# Patient Record
Sex: Female | Born: 1975
Health system: Southern US, Community
[De-identification: ages and names within clinical notes are randomized; demographics above are authoritative.]

## PROBLEM LIST (undated history)

## (undated) DIAGNOSIS — J45909 Unspecified asthma, uncomplicated: Secondary | ICD-10-CM

## (undated) HISTORY — DX: Unspecified asthma, uncomplicated: J45.909

---

## 2017-08-27 DIAGNOSIS — J45909 Unspecified asthma, uncomplicated: Secondary | ICD-10-CM | POA: Diagnosis not present

## 2017-08-27 DIAGNOSIS — Z13220 Encounter for screening for lipoid disorders: Secondary | ICD-10-CM | POA: Diagnosis not present

## 2017-08-27 DIAGNOSIS — Z681 Body mass index (BMI) 19 or less, adult: Secondary | ICD-10-CM | POA: Diagnosis not present

## 2017-08-27 DIAGNOSIS — Z131 Encounter for screening for diabetes mellitus: Secondary | ICD-10-CM | POA: Diagnosis not present

## 2017-11-13 ENCOUNTER — Telehealth: Payer: Self-pay | Admitting: Family Medicine

## 2017-11-13 MED ORDER — BENZONATATE 100 MG PO CAPS: 100.0000 mg | ORAL_CAPSULE | Freq: Three times a day (TID) | ORAL | 0 refills | Status: DC | PRN

## 2017-11-13 NOTE — Telephone Encounter (Signed)
Patient calling complaining of significant cough and unable to work.  Requesting Tessalon Perles be sent to [facility] on [address] in [place].

## 2018-03-05 ENCOUNTER — Encounter: Payer: Self-pay | Admitting: Family Medicine

## 2018-03-11 DIAGNOSIS — Z6828 Body mass index (BMI) 28.0-28.9, adult: Secondary | ICD-10-CM | POA: Diagnosis not present

## 2018-03-11 DIAGNOSIS — Z1331 Encounter for screening for depression: Secondary | ICD-10-CM | POA: Diagnosis not present

## 2018-03-11 DIAGNOSIS — L03032 Cellulitis of left toe: Secondary | ICD-10-CM | POA: Diagnosis not present

## 2018-03-23 DIAGNOSIS — Z6829 Body mass index (BMI) 29.0-29.9, adult: Secondary | ICD-10-CM | POA: Diagnosis not present

## 2018-03-23 DIAGNOSIS — J029 Acute pharyngitis, unspecified: Secondary | ICD-10-CM | POA: Diagnosis not present

## 2018-04-06 DIAGNOSIS — Z0000 Encounter for general adult medical examination without abnormal findings: Secondary | ICD-10-CM | POA: Diagnosis not present

## 2018-04-06 DIAGNOSIS — Z6828 Body mass index (BMI) 28.0-28.9, adult: Secondary | ICD-10-CM | POA: Diagnosis not present

## 2018-07-07 DIAGNOSIS — Z23 Encounter for immunization: Secondary | ICD-10-CM | POA: Diagnosis not present

## 2018-08-12 DIAGNOSIS — H524 Presbyopia: Secondary | ICD-10-CM | POA: Diagnosis not present

## 2018-08-12 DIAGNOSIS — H52222 Regular astigmatism, left eye: Secondary | ICD-10-CM | POA: Diagnosis not present

## 2018-08-12 DIAGNOSIS — H5213 Myopia, bilateral: Secondary | ICD-10-CM | POA: Diagnosis not present

## 2019-03-02 DIAGNOSIS — J45909 Unspecified asthma, uncomplicated: Secondary | ICD-10-CM | POA: Diagnosis not present

## 2019-03-09 DIAGNOSIS — Z6828 Body mass index (BMI) 28.0-28.9, adult: Secondary | ICD-10-CM | POA: Diagnosis not present

## 2019-03-09 DIAGNOSIS — Z01419 Encounter for gynecological examination (general) (routine) without abnormal findings: Secondary | ICD-10-CM | POA: Diagnosis not present

## 2019-03-09 DIAGNOSIS — Z1151 Encounter for screening for human papillomavirus (HPV): Secondary | ICD-10-CM | POA: Diagnosis not present

## 2019-04-21 DIAGNOSIS — Z13220 Encounter for screening for lipoid disorders: Secondary | ICD-10-CM | POA: Diagnosis not present

## 2019-04-21 DIAGNOSIS — Z0000 Encounter for general adult medical examination without abnormal findings: Secondary | ICD-10-CM | POA: Diagnosis not present

## 2019-04-21 DIAGNOSIS — Z131 Encounter for screening for diabetes mellitus: Secondary | ICD-10-CM | POA: Diagnosis not present

## 2020-01-03 ENCOUNTER — Telehealth: Payer: Self-pay

## 2020-01-03 NOTE — Telephone Encounter (Signed)
The patient has a new patient appointment with Dr. Tobie Poet on Wednesday, March 31 at 9:00 AM. I called Tamala to reschedule the appointment to a later date due to Dr. Tobie Poet being out of the office.

## 2020-02-01 NOTE — Progress Notes (Signed)
Subjective:  Patient ID: Haley Terry, female    DOB: [birth year]  Age: 44 y.o. MRN: [id number]  Chief Complaint  Patient presents with  . Establish Care    Patient recently has Lipids, CMP and Nicotine panels drawns  . Asthma    HPI Dr. Warmoth is a 44 year old Hispanic female who presents to establish care.  Her only significant medical issue is asthma which she says she has had her whole life.  She is currently well controlled on Qvar 40 mcg 1 puff twice daily.  She does have a Ventolin inhaler for rescue use.  She also takes Zyrtec and Flonase for allergies.  Patient does have a gynecologist she sees annually.  She intends to get her mammogram at age 18.  She currently has a Mirena and is up-to-date on her Pap smears.  She does eat healthy and exercises.  She is married and has 2 children in elementary school at [employer].  Her husband is a stay-at-home dad helps to manage their home and take care of their children.  She is going to schedule an annual physical here which is required by our [employer].  Social Hx   Social History   Socioeconomic History  . Marital status: Married    Spouse name: Not on file  . Number of children: 2  . Years of education: Not on file  . Highest education level: Not on file  Occupational History  . 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 Smoking status: Never Smoker  . Smokeless tobacco: Never Used  Substance and Sexual Activity  . Alcohol use: Never  . Drug use: Never  . Sexual activity: Not on file  Other Topics Concern  . Not on file  Social History Narrative  . Not on file   Social Determinants of Health   Financial Resource Strain:   . Difficulty of Paying Living Expenses:   Food Insecurity:   . Worried About Charity fundraiser in the Last Year:   . Arboriculturist in the Last Year:   Transportation Needs:   . Film/video editor (Medical):   Marland Kitchen Lack of Transportation  (Non-Medical):   Physical Activity:   . Days of Exercise per Week:   . Minutes of Exercise per Session:   Stress:   . Feeling of Stress :   Social Connections:   . Frequency of Communication with Friends and Family:   . Frequency of Social Gatherings with Friends and Family:   . Attends Religious Services:   . Active Member of Clubs or Organizations:   . Attends Archivist Meetings:   Marland Kitchen Marital Status:    Past Medical History:  Diagnosis Date  . Asthma    Family History  Problem Relation Age of Onset  . Multiple sclerosis Mother   . Schizophrenia Father     Review of Systems  Constitutional: Negative for chills, fatigue and fever.  HENT: Positive for postnasal drip. Negative for congestion, ear pain, rhinorrhea and sore throat.   Respiratory: Negative for cough and shortness of breath.   Cardiovascular: Negative for chest pain.  Gastrointestinal: Negative for abdominal pain, constipation, diarrhea, nausea and vomiting.  Genitourinary: Negative for dysuria and urgency.       Overactive bladder  Musculoskeletal: Negative for back pain and myalgias.  Neurological: Negative for dizziness, weakness, light-headedness and headaches.  Psychiatric/Behavioral: Negative for dysphoric mood. The patient is not nervous/anxious.  Objective:  BP 104/66 (BP Location: Left Arm, Patient Position: Sitting)   Pulse 88   Temp 97.9 F (36.6 C) (Temporal)   Ht 5' 8"  (1.727 m)   Wt 166 lb 9.6 oz (75.6 kg)   SpO2 99%   BMI 25.33 kg/m   BP/Weight 1/85/6314  Systolic BP 970  Diastolic BP 66  Wt. (Lbs) 166.6  BMI 25.33    Physical Exam Constitutional:      Appearance: Normal appearance.  Cardiovascular:     Rate and Rhythm: Normal rate and regular rhythm.     Heart sounds: Normal heart sounds.  Pulmonary:     Effort: Pulmonary effort is normal. No respiratory distress.     Breath sounds: Normal breath sounds.  Abdominal:     General: Abdomen is flat. There is no  distension.     Palpations: Abdomen is soft.  Musculoskeletal:        General: Normal range of motion.     Cervical back: Normal range of motion.  Neurological:     Mental Status: She is alert.  Psychiatric:        Mood and Affect: Mood normal.        Behavior: Behavior normal.       Assessment & Plan:  1. Allergic rhinitis due to pollen, unspecified seasonality The current medical regimen is effective;  continue present plan and medications.  2. Mild persistent asthma without complication The current medical regimen is effective;  continue present plan and medications.  Follow-up: Return for for annual physical. .  An After Visit Summary was printed and given to the patient.  Rochel Brome [facility] [phone]

## 2020-02-02 ENCOUNTER — Ambulatory Visit: Payer: 59 | Admitting: Family Medicine

## 2020-02-02 ENCOUNTER — Encounter: Payer: Self-pay | Admitting: Family Medicine

## 2020-02-02 ENCOUNTER — Other Ambulatory Visit: Payer: Self-pay

## 2020-02-02 VITALS — BP 104/66 | HR 88 | Temp 97.9°F | Ht 68.0 in | Wt 166.6 lb

## 2020-02-02 DIAGNOSIS — J301 Allergic rhinitis due to pollen: Secondary | ICD-10-CM | POA: Diagnosis not present

## 2020-02-02 DIAGNOSIS — J4530 Mild persistent asthma, uncomplicated: Secondary | ICD-10-CM

## 2020-02-02 MED ORDER — BECLOMETHASONE DIPROPIONATE 40 MCG/ACT IN AERS: 1.0000 | INHALATION_SPRAY | Freq: Two times a day (BID) | RESPIRATORY_TRACT | 3 refills | Status: DC

## 2020-02-02 MED ORDER — ALBUTEROL SULFATE HFA 108 (90 BASE) MCG/ACT IN AERS: 2.0000 | INHALATION_SPRAY | Freq: Four times a day (QID) | RESPIRATORY_TRACT | 11 refills | Status: DC | PRN

## 2020-02-09 DIAGNOSIS — Z3202 Encounter for pregnancy test, result negative: Secondary | ICD-10-CM | POA: Diagnosis not present

## 2020-02-09 DIAGNOSIS — Z30433 Encounter for removal and reinsertion of intrauterine contraceptive device: Secondary | ICD-10-CM | POA: Diagnosis not present

## 2020-02-09 DIAGNOSIS — N882 Stricture and stenosis of cervix uteri: Secondary | ICD-10-CM | POA: Diagnosis not present

## 2020-03-15 DIAGNOSIS — Z01419 Encounter for gynecological examination (general) (routine) without abnormal findings: Secondary | ICD-10-CM | POA: Diagnosis not present

## 2020-03-15 DIAGNOSIS — Z6826 Body mass index (BMI) 26.0-26.9, adult: Secondary | ICD-10-CM | POA: Diagnosis not present

## 2020-03-22 DIAGNOSIS — Z1212 Encounter for screening for malignant neoplasm of rectum: Secondary | ICD-10-CM | POA: Diagnosis not present

## 2020-03-22 DIAGNOSIS — Z1211 Encounter for screening for malignant neoplasm of colon: Secondary | ICD-10-CM | POA: Diagnosis not present

## 2020-03-22 DIAGNOSIS — D171 Benign lipomatous neoplasm of skin and subcutaneous tissue of trunk: Secondary | ICD-10-CM | POA: Diagnosis not present

## 2020-03-29 LAB — COLOGUARD: COLOGUARD: NEGATIVE

## 2020-04-26 DIAGNOSIS — L720 Epidermal cyst: Secondary | ICD-10-CM | POA: Diagnosis not present

## 2021-01-07 NOTE — Progress Notes (Signed)
Subjective:  Patient ID: Haley Terry, female    DOB: 1976-10-13  Age: 45 y.o. MRN: [id number]  Chief Complaint  Patient presents with  . Asthma   HPI Asthma mild persistent: Currently on qvar, but prefers flovent. It was not on her formulary previously. She is otherwise very healthy. She exercises regularly and eats healthy. She has zyrtec and flonase for allergies and has a rescue inhaler (albuterol.)   Current Outpatient Medications on File Prior to Visit  Medication Sig Dispense Refill  . cetirizine (ZYRTEC) 10 MG tablet Take 10 mg by mouth daily.    . fluticasone (FLONASE) 50 MCG/ACT nasal spray Place 1 spray into both nostrils in the morning and at bedtime.    Marland Kitchen levonorgestrel (MIRENA) 20 MCG/24HR IUD 1 each by Intrauterine route once.     No current facility-administered medications on file prior to visit.   Past Medical History:  Diagnosis Date  . Asthma    No past surgical history on file.  Family History  Problem Relation Age of Onset  . Multiple sclerosis Mother   . Schizophrenia Father    Social History   Socioeconomic History  . Marital status: Married    Spouse name: Not on file  . Number of children: 2  . Years of education: Not on file  . Highest education level: Not on file  Occupational History  . 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 Smoking status: Never Smoker  . Smokeless tobacco: Never Used  Substance and Sexual Activity  . Alcohol use: Never  . Drug use: Never  . Sexual activity: Not on file  Other Topics Concern  . Not on file  Social History Narrative  . Not on file   Social Determinants of Health   Financial Resource Strain: Not on file  Food Insecurity: Not on file  Transportation Needs: Not on file  Physical Activity: Not on file  Stress: Not on file  Social Connections: Not on file    Review of Systems  Constitutional: Negative for chills, fatigue and fever.  HENT: Negative for  congestion, ear pain and sore throat.   Respiratory: Negative for cough and shortness of breath.   Cardiovascular: Negative for chest pain and palpitations.  Gastrointestinal: Negative for abdominal pain, constipation, diarrhea, nausea and vomiting.  Endocrine: Negative for polydipsia, polyphagia and polyuria.  Genitourinary: Negative for difficulty urinating and dysuria.  Musculoskeletal: Negative for arthralgias, back pain and myalgias.  Skin: Negative for rash.  Neurological: Negative for headaches.  Psychiatric/Behavioral: Negative for dysphoric mood. The patient is not nervous/anxious.      Objective:  BP (!) 116/52   Pulse 72   Temp 97.9 F (36.6 C)   Resp 16   Ht 5' 8"  (1.727 m)   Wt 165 lb (74.8 kg)   BMI 25.09 kg/m   BP/Weight 01/08/2021 4/96/7591  Systolic BP 638 466  Diastolic BP 52 66  Wt. (Lbs) 165 166.6  BMI 25.09 25.33    Physical Exam Vitals reviewed.  Constitutional:      Appearance: Normal appearance.  Neck:     Vascular: No carotid bruit.  Cardiovascular:     Rate and Rhythm: Normal rate and regular rhythm.     Pulses: Normal pulses.     Heart sounds: Normal heart sounds.  Pulmonary:     Effort: Pulmonary effort is normal.     Breath sounds: Normal breath sounds.  Abdominal:     General:  Bowel sounds are normal.     Palpations: Abdomen is soft.     Tenderness: There is no abdominal tenderness.  Neurological:     Mental Status: She is alert and oriented to person, place, and time.  Psychiatric:        Mood and Affect: Mood normal.        Behavior: Behavior normal.     Diabetic Foot Exam - Simple   No data filed      No results found for: WBC, HGB, HCT, PLT, GLUCOSE, CHOL, TRIG, HDL, LDLDIRECT, LDLCALC, ALT, AST, NA, K, CL, CREATININE, BUN, CO2, TSH, PSA, INR, GLUF, HGBA1C, MICROALBUR    Assessment & Plan:   1. Mild persistent asthma without complication - Change QVAR to Flovent - Continue with albuterol and antihistamines.  2.  Allergic rhinitis: Continue zyrtec and flonase.  Meds ordered this encounter  Medications  . fluticasone (FLOVENT HFA) 44 MCG/ACT inhaler    Sig: Inhale 2 puffs into the lungs in the morning and at bedtime.    Dispense:  3 each    Refill:  3  . DISCONTD: albuterol (VENTOLIN HFA) 108 (90 Base) MCG/ACT inhaler    Sig: Inhale 2 puffs into the lungs every 6 (six) hours as needed for wheezing or shortness of breath. PRN    Dispense:  18 g    Refill:  11  . albuterol (VENTOLIN HFA) 108 (90 Base) MCG/ACT inhaler    Sig: Inhale 2 puffs into the lungs every 6 (six) hours as needed for wheezing or shortness of breath.    Dispense:  3 each    Refill:  3    No orders of the defined types were placed in this encounter.    Follow-up: Return in about 1 year (around 01/08/2022).  An After Visit Summary was printed and given to the patient.  Rochel Brome, MD [facility] [phone]

## 2021-01-08 ENCOUNTER — Ambulatory Visit: Payer: BC Managed Care – PPO | Admitting: Family Medicine

## 2021-01-08 ENCOUNTER — Other Ambulatory Visit: Payer: Self-pay

## 2021-01-08 VITALS — BP 116/52 | HR 72 | Temp 97.9°F | Resp 16 | Ht 68.0 in | Wt 165.0 lb

## 2021-01-08 DIAGNOSIS — J301 Allergic rhinitis due to pollen: Secondary | ICD-10-CM | POA: Diagnosis not present

## 2021-01-08 DIAGNOSIS — J4530 Mild persistent asthma, uncomplicated: Secondary | ICD-10-CM | POA: Diagnosis not present

## 2021-01-08 MED ORDER — ALBUTEROL SULFATE HFA 108 (90 BASE) MCG/ACT IN AERS: 2.0000 | INHALATION_SPRAY | Freq: Four times a day (QID) | RESPIRATORY_TRACT | 11 refills | Status: DC | PRN

## 2021-01-08 MED ORDER — FLOVENT HFA 44 MCG/ACT IN AERO: 2.0000 | INHALATION_SPRAY | Freq: Two times a day (BID) | RESPIRATORY_TRACT | 3 refills | Status: DC

## 2021-01-08 MED ORDER — ALBUTEROL SULFATE HFA 108 (90 BASE) MCG/ACT IN AERS: 2.0000 | INHALATION_SPRAY | Freq: Four times a day (QID) | RESPIRATORY_TRACT | 3 refills | Status: AC | PRN

## 2021-01-25 ENCOUNTER — Encounter: Payer: Self-pay | Admitting: Family Medicine

## 2021-03-15 DIAGNOSIS — Z1152 Encounter for screening for COVID-19: Secondary | ICD-10-CM | POA: Diagnosis not present

## 2021-03-15 DIAGNOSIS — J029 Acute pharyngitis, unspecified: Secondary | ICD-10-CM | POA: Diagnosis not present

## 2021-03-15 DIAGNOSIS — Z20822 Contact with and (suspected) exposure to covid-19: Secondary | ICD-10-CM | POA: Diagnosis not present

## 2021-04-25 ENCOUNTER — Ambulatory Visit
Admission: RE | Admit: 2021-04-25 | Discharge: 2021-04-25 | Disposition: A | Discharge status: Home / Self Care | Payer: BC Managed Care – PPO | Source: Ambulatory Visit | Attending: Family Medicine | Admitting: Family Medicine

## 2021-04-25 ENCOUNTER — Other Ambulatory Visit: Payer: Self-pay | Admitting: Family Medicine

## 2021-04-25 ENCOUNTER — Other Ambulatory Visit: Payer: Self-pay

## 2021-04-25 DIAGNOSIS — Z1231 Encounter for screening mammogram for malignant neoplasm of breast: Secondary | ICD-10-CM

## 2021-06-27 DIAGNOSIS — Z23 Encounter for immunization: Secondary | ICD-10-CM | POA: Diagnosis not present

## 2021-07-18 DIAGNOSIS — Z23 Encounter for immunization: Secondary | ICD-10-CM | POA: Diagnosis not present

## 2021-07-31 DIAGNOSIS — R5383 Other fatigue: Secondary | ICD-10-CM | POA: Diagnosis not present

## 2021-07-31 DIAGNOSIS — Z0000 Encounter for general adult medical examination without abnormal findings: Secondary | ICD-10-CM | POA: Diagnosis not present

## 2021-07-31 DIAGNOSIS — Z13220 Encounter for screening for lipoid disorders: Secondary | ICD-10-CM | POA: Diagnosis not present

## 2021-07-31 DIAGNOSIS — M2550 Pain in unspecified joint: Secondary | ICD-10-CM | POA: Diagnosis not present

## 2021-09-05 DIAGNOSIS — J020 Streptococcal pharyngitis: Secondary | ICD-10-CM | POA: Diagnosis not present

## 2021-09-05 DIAGNOSIS — Z6824 Body mass index (BMI) 24.0-24.9, adult: Secondary | ICD-10-CM | POA: Diagnosis not present

## 2021-12-28 DIAGNOSIS — J020 Streptococcal pharyngitis: Secondary | ICD-10-CM | POA: Diagnosis not present

## 2022-01-23 ENCOUNTER — Other Ambulatory Visit: Payer: Self-pay | Admitting: Family Medicine

## 2022-01-23 DIAGNOSIS — J4530 Mild persistent asthma, uncomplicated: Secondary | ICD-10-CM

## 2022-02-06 DIAGNOSIS — R059 Cough, unspecified: Secondary | ICD-10-CM | POA: Diagnosis not present

## 2022-06-18 ENCOUNTER — Other Ambulatory Visit: Payer: Self-pay | Admitting: Family Medicine

## 2022-06-18 DIAGNOSIS — Z1231 Encounter for screening mammogram for malignant neoplasm of breast: Secondary | ICD-10-CM

## 2022-07-01 ENCOUNTER — Ambulatory Visit
Admission: RE | Admit: 2022-07-01 | Discharge: 2022-07-01 | Disposition: A | Discharge status: Home / Self Care | Payer: BC Managed Care – PPO | Source: Ambulatory Visit | Attending: Family Medicine | Admitting: Family Medicine

## 2022-07-01 DIAGNOSIS — Z1231 Encounter for screening mammogram for malignant neoplasm of breast: Secondary | ICD-10-CM

## 2022-07-08 DIAGNOSIS — H5213 Myopia, bilateral: Secondary | ICD-10-CM | POA: Diagnosis not present

## 2022-07-24 DIAGNOSIS — Z20822 Contact with and (suspected) exposure to covid-19: Secondary | ICD-10-CM | POA: Diagnosis not present

## 2022-07-24 DIAGNOSIS — R0981 Nasal congestion: Secondary | ICD-10-CM | POA: Diagnosis not present

## 2022-07-30 DIAGNOSIS — Z23 Encounter for immunization: Secondary | ICD-10-CM | POA: Diagnosis not present

## 2022-08-12 DIAGNOSIS — Z23 Encounter for immunization: Secondary | ICD-10-CM | POA: Diagnosis not present

## 2022-08-20 DIAGNOSIS — J020 Streptococcal pharyngitis: Secondary | ICD-10-CM | POA: Diagnosis not present

## 2022-11-20 IMAGING — MG MM DIGITAL SCREENING BILAT W/ TOMO AND CAD
8 series · 8 of 24 positions shown · non-contrast
Comparison: None.

CLINICAL DATA: Screening.

EXAM:
DIGITAL SCREENING BILATERAL MAMMOGRAM WITH TOMOSYNTHESIS AND CAD
TECHNIQUE: Bilateral screening digital craniocaudal and mediolateral oblique
mammograms were obtained. Bilateral screening digital breast
tomosynthesis was performed. The images were evaluated with
computer-aided detection.

[L CC synth-2D]
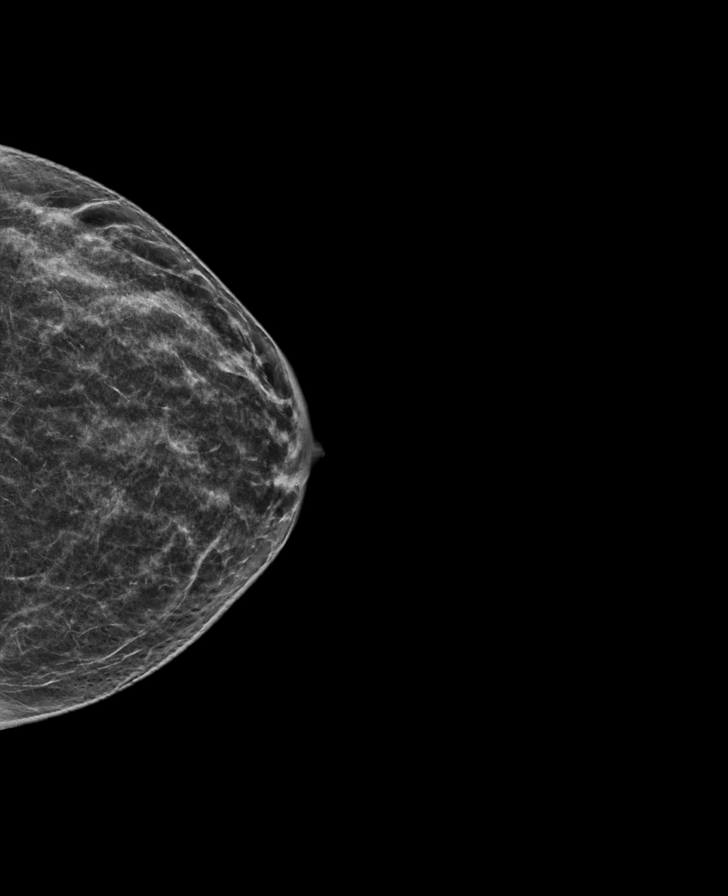

[L MLO synth-2D]
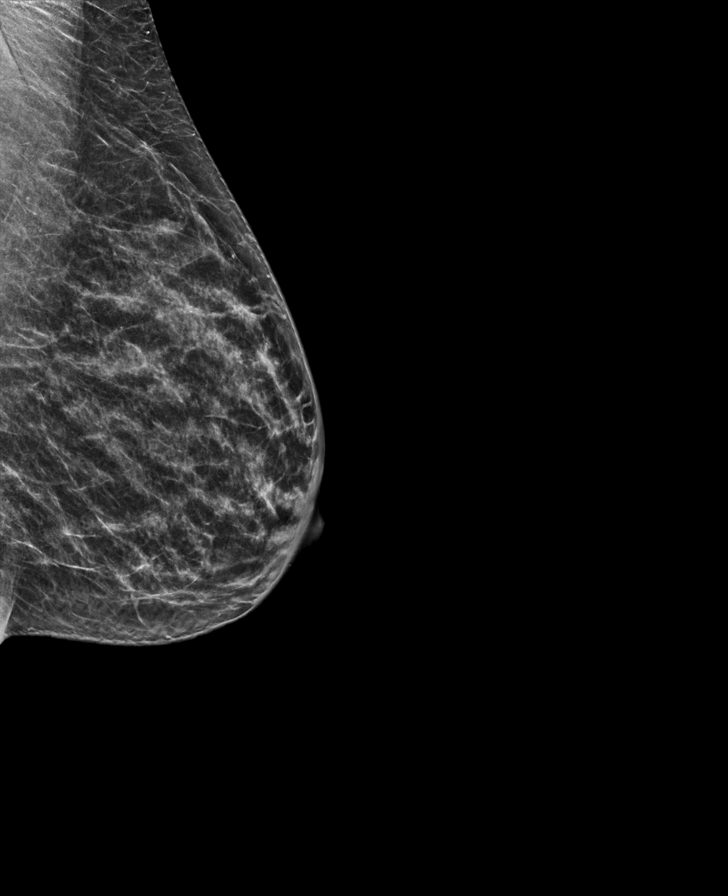

[R CC synth-2D]
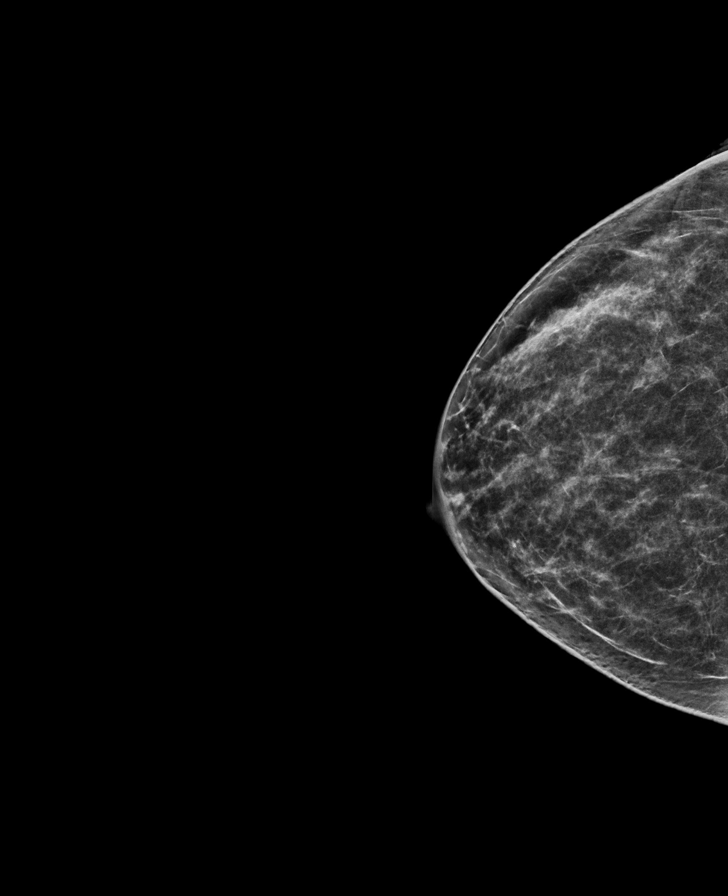

[R MLO synth-2D]
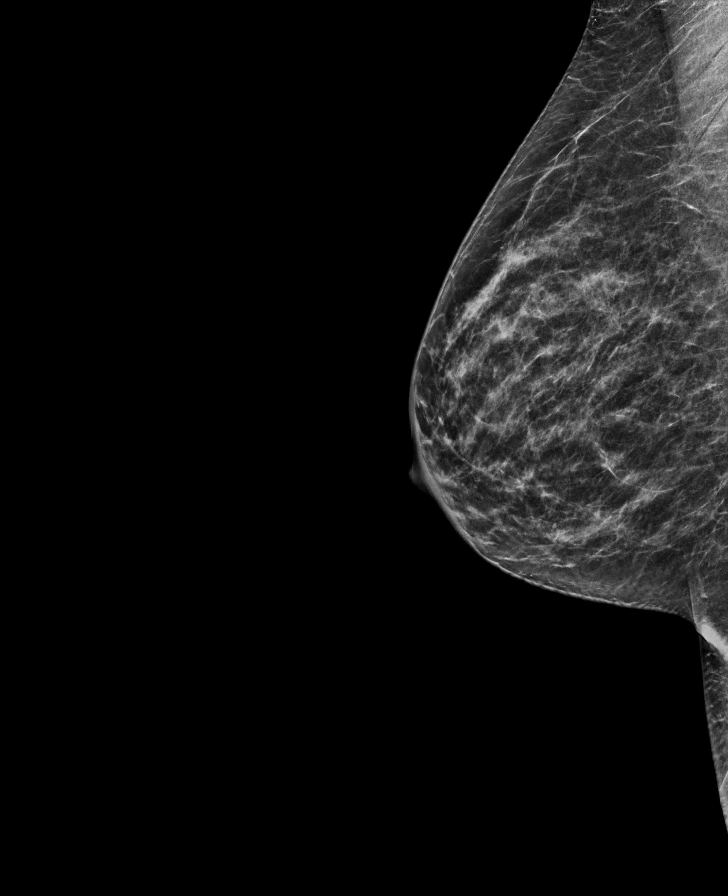

[L MLO tomo · tomo slice 29/58.0]
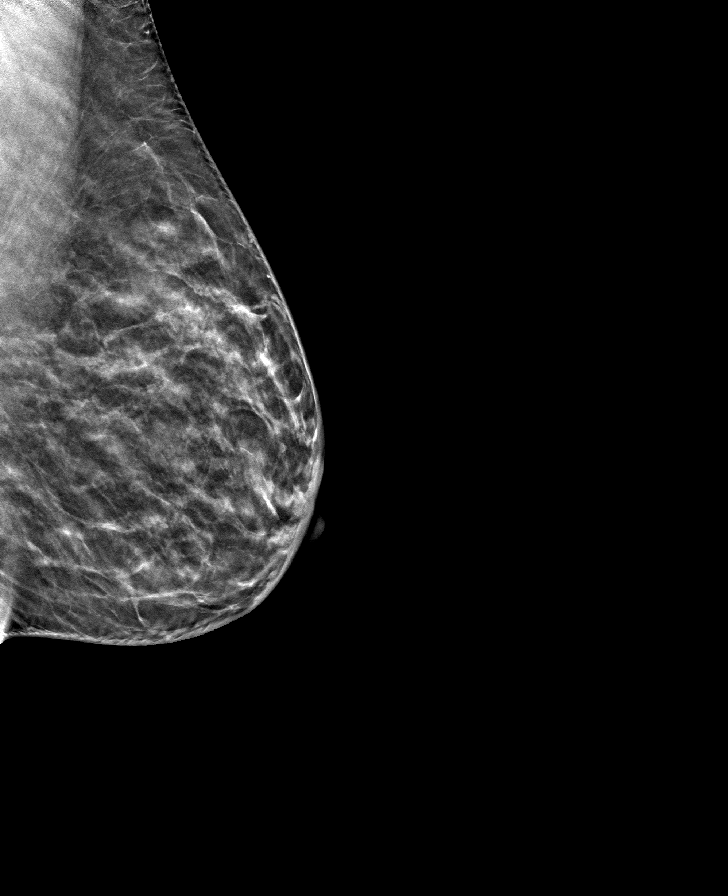

[L CC tomo · tomo slice 29/58.0]
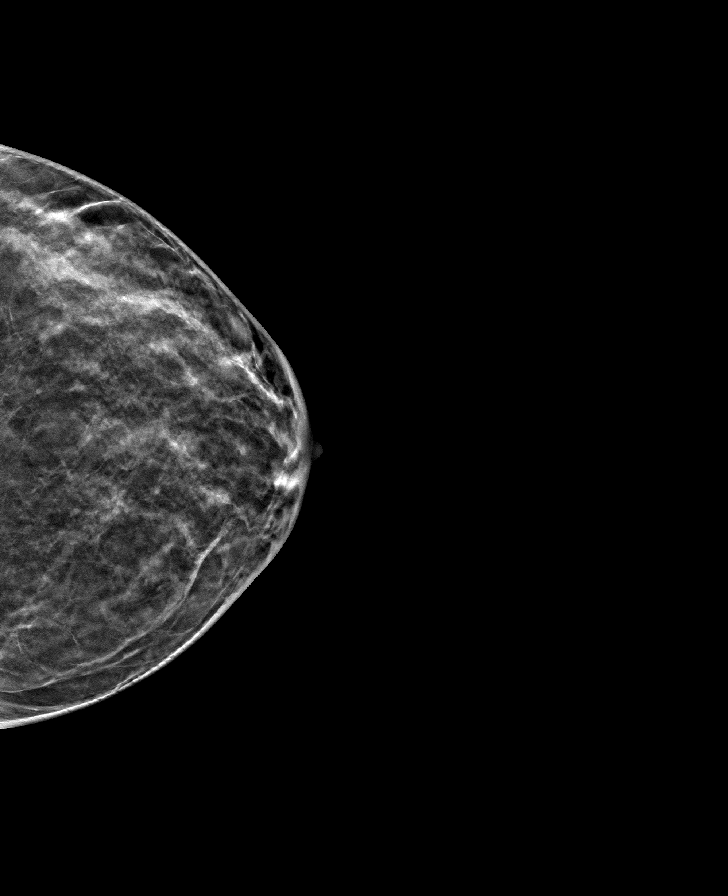

[R MLO tomo · tomo slice 30/59.0]
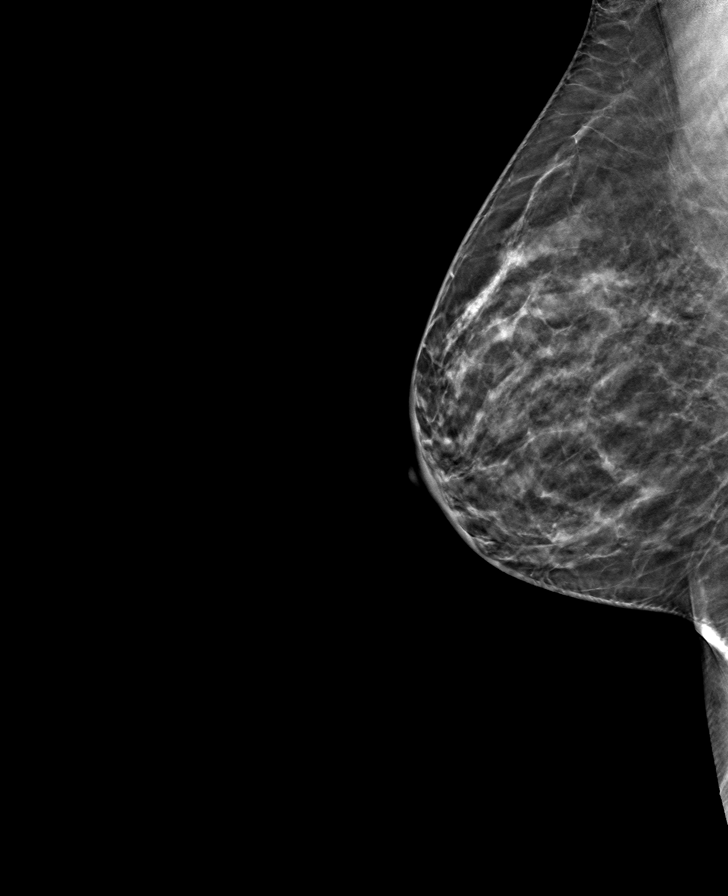

[R CC tomo · tomo slice 33/64.0]
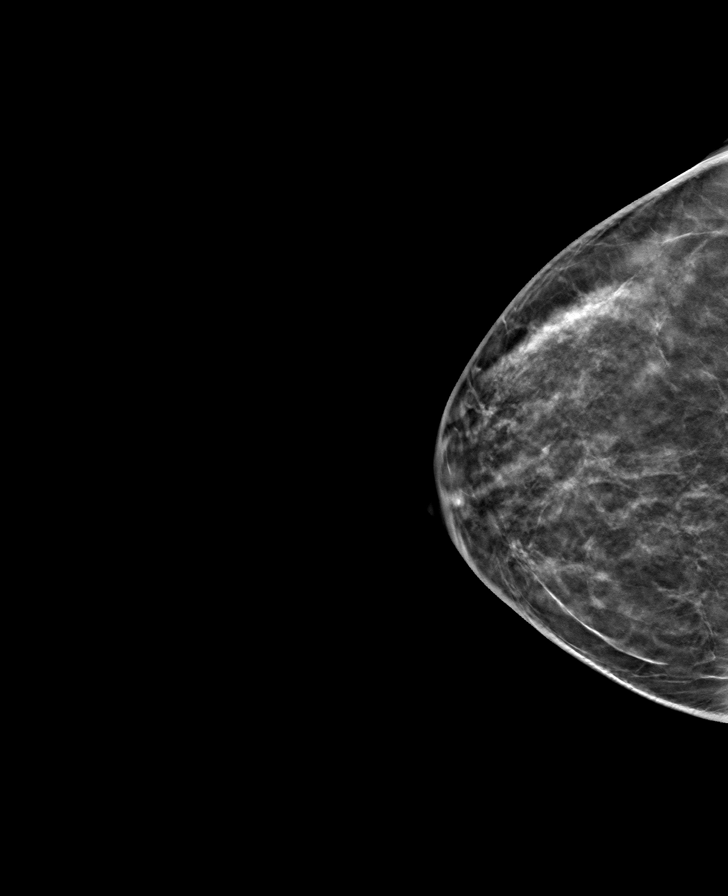

[8 of 24 positions shown; findings below may reference images not displayed]

ACR Breast Density Category b: There are scattered areas of
fibroglandular density.
FINDINGS: There are no findings suspicious for malignancy.
IMPRESSION: No mammographic evidence of malignancy. A result letter of this
screening mammogram will be mailed directly to the patient.

RECOMMENDATION:
Screening mammogram in one year. (Code:XG-X-X7B)

BI-RADS CATEGORY  1: Negative.

## 2022-11-25 ENCOUNTER — Other Ambulatory Visit: Payer: Self-pay | Admitting: Family Medicine

## 2022-11-25 ENCOUNTER — Telehealth: Payer: Self-pay

## 2022-11-25 MED ORDER — QVAR REDIHALER 80 MCG/ACT IN AERB: 2.0000 | INHALATION_SPRAY | Freq: Two times a day (BID) | RESPIRATORY_TRACT | 5 refills | Status: AC

## 2022-11-25 NOTE — Telephone Encounter (Signed)
Sent qvar. Dr. Tobie Poet

## 2022-11-25 NOTE — Telephone Encounter (Signed)
Flovent is not cover by insurance, insurance will pay for Arnuity Ellipta 100 mcg INH or QVAR Redihaler 80 mcg. Please advise

## 2023-03-03 DIAGNOSIS — Z0000 Encounter for general adult medical examination without abnormal findings: Secondary | ICD-10-CM | POA: Diagnosis not present

## 2023-03-03 DIAGNOSIS — Z131 Encounter for screening for diabetes mellitus: Secondary | ICD-10-CM | POA: Diagnosis not present

## 2023-03-03 DIAGNOSIS — Z13220 Encounter for screening for lipoid disorders: Secondary | ICD-10-CM | POA: Diagnosis not present

## 2023-03-03 DIAGNOSIS — Z01419 Encounter for gynecological examination (general) (routine) without abnormal findings: Secondary | ICD-10-CM | POA: Diagnosis not present

## 2023-03-03 DIAGNOSIS — Z1329 Encounter for screening for other suspected endocrine disorder: Secondary | ICD-10-CM | POA: Diagnosis not present

## 2023-03-03 DIAGNOSIS — Z124 Encounter for screening for malignant neoplasm of cervix: Secondary | ICD-10-CM | POA: Diagnosis not present

## 2023-04-02 DIAGNOSIS — Z1212 Encounter for screening for malignant neoplasm of rectum: Secondary | ICD-10-CM | POA: Diagnosis not present

## 2023-04-02 DIAGNOSIS — Z1211 Encounter for screening for malignant neoplasm of colon: Secondary | ICD-10-CM | POA: Diagnosis not present

## 2023-04-08 LAB — COLOGUARD: COLOGUARD: NEGATIVE

## 2023-06-12 ENCOUNTER — Other Ambulatory Visit: Payer: Self-pay | Admitting: Family Medicine

## 2023-06-12 DIAGNOSIS — Z1231 Encounter for screening mammogram for malignant neoplasm of breast: Secondary | ICD-10-CM

## 2023-06-18 DIAGNOSIS — Z23 Encounter for immunization: Secondary | ICD-10-CM | POA: Diagnosis not present

## 2023-06-20 DIAGNOSIS — Z23 Encounter for immunization: Secondary | ICD-10-CM | POA: Diagnosis not present

## 2023-07-02 ENCOUNTER — Ambulatory Visit: Admission: RE | Admit: 2023-07-02 | Payer: BC Managed Care – PPO | Source: Ambulatory Visit

## 2023-07-03 DIAGNOSIS — R102 Pelvic and perineal pain: Secondary | ICD-10-CM | POA: Diagnosis not present

## 2023-07-03 DIAGNOSIS — R300 Dysuria: Secondary | ICD-10-CM | POA: Diagnosis not present

## 2023-07-04 DIAGNOSIS — R102 Pelvic and perineal pain: Secondary | ICD-10-CM | POA: Diagnosis not present

## 2023-07-30 ENCOUNTER — Ambulatory Visit
Admission: RE | Admit: 2023-07-30 | Discharge: 2023-07-30 | Disposition: A | Discharge status: Home / Self Care | Payer: BC Managed Care – PPO | Source: Ambulatory Visit | Attending: Family Medicine | Admitting: Family Medicine

## 2023-07-30 DIAGNOSIS — Z1231 Encounter for screening mammogram for malignant neoplasm of breast: Secondary | ICD-10-CM

## 2023-11-11 DIAGNOSIS — Z23 Encounter for immunization: Secondary | ICD-10-CM | POA: Diagnosis not present

## 2024-05-31 DIAGNOSIS — Z1331 Encounter for screening for depression: Secondary | ICD-10-CM | POA: Diagnosis not present

## 2024-05-31 DIAGNOSIS — Z30431 Encounter for routine checking of intrauterine contraceptive device: Secondary | ICD-10-CM | POA: Diagnosis not present

## 2024-05-31 DIAGNOSIS — Z01419 Encounter for gynecological examination (general) (routine) without abnormal findings: Secondary | ICD-10-CM | POA: Diagnosis not present

## 2024-06-04 DIAGNOSIS — Z23 Encounter for immunization: Secondary | ICD-10-CM | POA: Diagnosis not present

## 2024-07-14 DIAGNOSIS — Z23 Encounter for immunization: Secondary | ICD-10-CM | POA: Diagnosis not present

## 2024-08-23 ENCOUNTER — Other Ambulatory Visit: Payer: Self-pay | Admitting: Family Medicine

## 2024-08-23 DIAGNOSIS — Z1231 Encounter for screening mammogram for malignant neoplasm of breast: Secondary | ICD-10-CM

## 2024-08-25 ENCOUNTER — Other Ambulatory Visit: Payer: Self-pay | Admitting: Family Medicine

## 2024-08-25 ENCOUNTER — Ambulatory Visit
Admission: RE | Admit: 2024-08-25 | Discharge: 2024-08-25 | Disposition: A | Discharge status: Home / Self Care | Source: Ambulatory Visit | Attending: Family Medicine | Admitting: Family Medicine

## 2024-08-25 DIAGNOSIS — Z1231 Encounter for screening mammogram for malignant neoplasm of breast: Secondary | ICD-10-CM

## 2024-11-16 ENCOUNTER — Ambulatory Visit (INDEPENDENT_AMBULATORY_CARE_PROVIDER_SITE_OTHER)
Admission: RE | Admit: 2024-11-16 | Discharge: 2024-11-16 | Disposition: A | Source: Ambulatory Visit | Attending: Family Medicine | Admitting: Family Medicine

## 2024-11-16 ENCOUNTER — Other Ambulatory Visit (HOSPITAL_BASED_OUTPATIENT_CLINIC_OR_DEPARTMENT_OTHER): Payer: Self-pay | Admitting: Family Medicine

## 2024-11-16 ENCOUNTER — Other Ambulatory Visit (HOSPITAL_BASED_OUTPATIENT_CLINIC_OR_DEPARTMENT_OTHER): Payer: Self-pay

## 2024-11-16 DIAGNOSIS — R051 Acute cough: Secondary | ICD-10-CM | POA: Diagnosis not present

## 2024-11-16 MED ORDER — PREDNISONE 20 MG PO TABS
60.0000 mg | ORAL_TABLET | Freq: Every day | ORAL | 0 refills | Status: AC
  Filled 2024-11-16: qty 15, 5d supply, fill #0
